# Patient Record
Sex: Male | Born: 1985 | Race: Black or African American | Hispanic: No | Marital: Single | Smoking: Never smoker
Health system: Southern US, Community
[De-identification: ages and names within clinical notes are randomized; demographics above are authoritative.]

---

## 2008-01-13 ENCOUNTER — Emergency Department (HOSPITAL_COMMUNITY): Admission: EM | Admit: 2008-01-13 | Discharge: 2008-01-13 | Payer: Self-pay | Admitting: Emergency Medicine

## 2016-12-23 ENCOUNTER — Encounter (HOSPITAL_COMMUNITY): Payer: Self-pay | Admitting: Emergency Medicine

## 2016-12-23 ENCOUNTER — Emergency Department (HOSPITAL_COMMUNITY)
Admission: EM | Admit: 2016-12-23 | Discharge: 2016-12-24 | Disposition: A | Discharge status: Home / Self Care | Payer: Self-pay | Attending: Emergency Medicine | Admitting: Emergency Medicine

## 2016-12-23 ENCOUNTER — Emergency Department (HOSPITAL_COMMUNITY): Payer: Self-pay

## 2016-12-23 DIAGNOSIS — S61217A Laceration without foreign body of left little finger without damage to nail, initial encounter: Secondary | ICD-10-CM

## 2016-12-23 DIAGNOSIS — Y998 Other external cause status: Secondary | ICD-10-CM | POA: Insufficient documentation

## 2016-12-23 DIAGNOSIS — Y9389 Activity, other specified: Secondary | ICD-10-CM | POA: Insufficient documentation

## 2016-12-23 DIAGNOSIS — Y929 Unspecified place or not applicable: Secondary | ICD-10-CM | POA: Insufficient documentation

## 2016-12-23 DIAGNOSIS — S61215A Laceration without foreign body of left ring finger without damage to nail, initial encounter: Secondary | ICD-10-CM | POA: Insufficient documentation

## 2016-12-23 DIAGNOSIS — W260XXA Contact with knife, initial encounter: Secondary | ICD-10-CM | POA: Insufficient documentation

## 2016-12-23 MED ORDER — OXYCODONE-ACETAMINOPHEN 5-325 MG PO TABS
1.0000 | ORAL_TABLET | Freq: Once | ORAL | Status: AC
  Administered 2016-12-24: 1 via ORAL
  Filled 2016-12-23: qty 1

## 2016-12-23 MED ORDER — TETANUS-DIPHTH-ACELL PERTUSSIS 5-2.5-18.5 LF-MCG/0.5 IM SUSP
0.5000 mL | Freq: Once | INTRAMUSCULAR | Status: AC
  Administered 2016-12-23: 0.5 mL via INTRAMUSCULAR
  Filled 2016-12-23: qty 0.5

## 2016-12-23 MED ORDER — LIDOCAINE HCL (PF) 1 % IJ SOLN
5.0000 mL | Freq: Once | INTRAMUSCULAR | Status: AC
  Administered 2016-12-23: 5 mL
  Filled 2016-12-23: qty 5

## 2016-12-23 NOTE — ED Triage Notes (Signed)
Pt reports cutting L pinky finger with knife while making dinner. Attempted to control bleeding but when pt removed bandage to take shower it started bleeding again. Wrapped and bleeding controlled in triage.

## 2016-12-24 MED ORDER — TRAMADOL HCL 50 MG PO TABS: 50.0000 mg | ORAL_TABLET | Freq: Four times a day (QID) | ORAL | 0 refills | Status: DC | PRN

## 2016-12-24 NOTE — ED Provider Notes (Signed)
[facility]-EMERGENCY DEPT Provider Note   CSN: [id number] Arrival date & time: 12/23/16  2231     History   Chief Complaint Chief Complaint  Patient presents with  . Finger Injury    HPI Troy Nielsen is a 31 y.o. male.  HPI   Patient is a 31 year old male with no pertinent past medical history presents the ED with complaint of finger laceration, onset 7 PM. Patient reports while he was cutting cilantro he cut his left pinky finger with a kitchen knife. He reports cleaning the wound with water and peroxide at home. Patient reports the bleeding was controlled with compression and applying a Band-Aid but notes when he removed the Band-Aid prior to taking a shower the bleeding return. Endorses associated pain. Denies numbness, weakness, decreased range of motion. Denies taking medications prior to arrival. Denies use of anticoagulants. Tetanus is not up-to-date.  History reviewed. No pertinent past medical history.  There are no active problems to display for this patient.   History reviewed. No pertinent surgical history.     Home Medications    Prior to Admission medications   Medication Sig Start Date End Date Taking? Authorizing Provider  traMADol (ULTRAM) 50 MG tablet Take 1 tablet (50 mg total) by mouth every 6 (six) hours as needed. 12/24/16   Barrett Henle, PA-C    Family History No family history on file.  Social History Social History  Substance Use Topics  . Smoking status: Not on file  . Smokeless tobacco: Not on file  . Alcohol use Not on file     Allergies   Patient has no known allergies.   Review of Systems Review of Systems  Constitutional: Negative for fever.  Musculoskeletal: Positive for arthralgias (left pinky finger).  Skin: Positive for wound (laceration).  Neurological: Negative for weakness and numbness.     Physical Exam Updated Vital Signs BP 129/68 (BP Location: Right Arm)   Pulse 87   Temp 98.6 F (37 C) (Oral)    Resp 18   Ht 6\' 1"  (1.854 m)   Wt 81.6 kg (180 lb)   SpO2 97%   BMI 23.75 kg/m   Physical Exam  Constitutional: He is oriented to person, place, and time. He appears well-developed and well-nourished.  HENT:  Head: Normocephalic and atraumatic.  Eyes: Conjunctivae and EOM are normal. Right eye exhibits no discharge. Left eye exhibits no discharge. No scleral icterus.  Neck: Normal range of motion. Neck supple.  Cardiovascular: Normal rate and intact distal pulses.   Pulmonary/Chest: Effort normal.  Musculoskeletal: Normal range of motion. He exhibits no edema or deformity.  FROM of left fingers, hand, wrist, forearm and elbow with 5/5 strength. Equal grip strength bilaterally. 2+ radial pulse. Sensation intact.  Neurological: He is alert and oriented to person, place, and time. No sensory deficit.  Skin: Skin is warm and dry. Capillary refill takes less than 2 seconds.  1.5cm laceration present to finger pad of left pinky finger with small visible arterial bleed, controlled with pressure. No visible bone or tendons. Mild TTP.   Nursing note and vitals reviewed.    ED Treatments / Results  Labs (all labs ordered are listed, but only abnormal results are displayed) Labs Reviewed - No data to display  EKG  EKG Interpretation None       Radiology No results found.  Procedures .Marland KitchenLaceration Repair Date/Time: 12/24/2016 12:15 AM Performed by: Barrett Henle Authorized by: Barrett Henle   Consent:  Consent obtained:  Verbal   Consent given by:  Patient Anesthesia (see MAR for exact dosages):    Anesthesia method:  Local infiltration   Local anesthetic:  Lidocaine 1% w/o epi Laceration details:    Location:  Finger   Finger location:  L small finger   Length (cm):  1.5 Repair type:    Repair type:  Simple Pre-procedure details:    Preparation:  Patient was prepped and draped in usual sterile fashion Exploration:    Hemostasis achieved with:   Tied off vessels (one 5-0 vicryl figure 8 suture)   Wound exploration: wound explored through full range of motion and entire depth of wound probed and visualized     Wound extent: no foreign bodies/material noted, no muscle damage noted, no nerve damage noted and no tendon damage noted     Contaminated: no   Treatment:    Area cleansed with:  Betadine and saline   Amount of cleaning:  Standard   Irrigation solution:  Sterile saline   Irrigation method:  Syringe   Visualized foreign bodies/material removed: no   Skin repair:    Repair method:  Sutures   Suture size:  5-0   Suture material:  Prolene   Suture technique:  Simple interrupted   Number of sutures:  3 Approximation:    Approximation:  Close   Vermilion border: well-aligned   Post-procedure details:    Dressing:  Antibiotic ointment and non-adherent dressing   Patient tolerance of procedure:  Tolerated well, no immediate complications    (including critical care time)  Medications Ordered in ED Medications  lidocaine (PF) (XYLOCAINE) 1 % injection 5 mL (5 mLs Infiltration Given 12/23/16 2334)  Tdap (BOOSTRIX) injection 0.5 mL (0.5 mLs Intramuscular Given 12/23/16 2333)  oxyCODONE-acetaminophen (PERCOCET/ROXICET) 5-325 MG per tablet 1 tablet (1 tablet Oral Given 12/24/16 0015)     Initial Impression / Assessment and Plan / ED Course  I have reviewed the triage vital signs and the nursing notes.  Pertinent labs & imaging results that were available during my care of the patient were reviewed by me and considered in my medical decision making (see chart for details).    Patient presents with laceration to his left pinky finger that occurred around 7 PM on he was cutting cilantro and his kitchen. Bleeding controlled with pressure. Endorses mild associated pain. Reports tetanus not up-to-date. VSS. Exam revealed 1.5 cm laceration present to finger pad of left pinky finger with small visible arterial bleed present, bleeding  controlled with pressure. Left hand otherwise neurovascularly intact. Bleeding controlled with single figure 8 suture and pressure dressing. Plan to order x-ray for evaluation of foreign bodies or bony disruption. I was notified by nurse that patient declined x-ray. After discussing reasoning for x-ray and possible risks associated with missing fx or foreign bodies and potential for infection, patient reported understanding of possible risks/complications but still continued to refuse x-ray. Tetanus updated in the ED. Pressure irrigation performed. Wound explored and base of wound visualized in a bloodless field without evidence of foreign body.  Laceration occurred < 8 hours prior to repair which was well tolerated. Pt has no comorbidities to effect normal wound healing. Pt discharged without antibiotics.  Discussed suture home care with patient and answered questions. Pt to follow-up for wound check and suture removal in 7 days; they are to return to the ED sooner for signs of infection. Pt is hemodynamically stable with no complaints prior to dc.     Final  Clinical Impressions(s) / ED Diagnoses   Final diagnoses:  Laceration of left little finger without foreign body without damage to nail, initial encounter    New Prescriptions New Prescriptions   TRAMADOL (ULTRAM) 50 MG TABLET    Take 1 tablet (50 mg total) by mouth every 6 (six) hours as needed.     Barrett Henleadeau, Nicole Elizabeth, PA-C 12/24/16 0036    Doug SouJacubowitz, Sam, MD 12/26/16 [id number]

## 2016-12-24 NOTE — Discharge Instructions (Signed)
Keep your wound clean using Dial antibacterial soap and water, pat dry. You may also apply a small amount of antibiotic ointment such as Neosporin to wound daily. You may take 600 mg ibuprofen every 6 hours as needed for pain relief. I also recommend resting, elevating and applying ice to right arm for 15-20 minutes 3-4 times daily to help with pain and swelling. Return to the emergency department or be seen by your primary care provider in 7 days for suture removal. Return to the emergency department sooner if symptoms worsen or new onset of fever, redness, swelling, warmth, drainage, decreased range of motion, numbness, tingling, weakness.

## 2017-08-20 ENCOUNTER — Encounter (HOSPITAL_COMMUNITY): Payer: Self-pay | Admitting: Emergency Medicine

## 2017-08-20 ENCOUNTER — Other Ambulatory Visit: Payer: Self-pay

## 2017-08-20 ENCOUNTER — Emergency Department (HOSPITAL_COMMUNITY): Payer: Self-pay

## 2017-08-20 ENCOUNTER — Emergency Department (HOSPITAL_COMMUNITY)
Admission: EM | Admit: 2017-08-20 | Discharge: 2017-08-20 | Disposition: A | Discharge status: Home / Self Care | Payer: Self-pay | Attending: Emergency Medicine | Admitting: Emergency Medicine

## 2017-08-20 DIAGNOSIS — S39012A Strain of muscle, fascia and tendon of lower back, initial encounter: Secondary | ICD-10-CM | POA: Insufficient documentation

## 2017-08-20 DIAGNOSIS — R102 Pelvic and perineal pain: Secondary | ICD-10-CM | POA: Insufficient documentation

## 2017-08-20 DIAGNOSIS — X500XXA Overexertion from strenuous movement or load, initial encounter: Secondary | ICD-10-CM | POA: Insufficient documentation

## 2017-08-20 DIAGNOSIS — Y929 Unspecified place or not applicable: Secondary | ICD-10-CM | POA: Insufficient documentation

## 2017-08-20 DIAGNOSIS — J069 Acute upper respiratory infection, unspecified: Secondary | ICD-10-CM | POA: Insufficient documentation

## 2017-08-20 DIAGNOSIS — Y939 Activity, unspecified: Secondary | ICD-10-CM | POA: Insufficient documentation

## 2017-08-20 DIAGNOSIS — Y990 Civilian activity done for income or pay: Secondary | ICD-10-CM | POA: Insufficient documentation

## 2017-08-20 LAB — CBC WITH DIFFERENTIAL/PLATELET
BASOS PCT: 0 %
Basophils Absolute: 0 10*3/uL (ref 0.0–0.1)
EOS ABS: 0.2 10*3/uL (ref 0.0–0.7)
EOS PCT: 1 %
HCT: 42.5 % (ref 39.0–52.0)
Hemoglobin: 14.1 g/dL (ref 13.0–17.0)
LYMPHS ABS: 1.3 10*3/uL (ref 0.7–4.0)
Lymphocytes Relative: 11 %
MCH: 30.4 pg (ref 26.0–34.0)
MCHC: 33.2 g/dL (ref 30.0–36.0)
MCV: 91.6 fL (ref 78.0–100.0)
Monocytes Absolute: 0.9 10*3/uL (ref 0.1–1.0)
Monocytes Relative: 7 %
Neutro Abs: 9.8 10*3/uL — ABNORMAL HIGH (ref 1.7–7.7)
Neutrophils Relative %: 81 %
PLATELETS: 226 10*3/uL (ref 150–400)
RBC: 4.64 MIL/uL (ref 4.22–5.81)
RDW: 12.8 % (ref 11.5–15.5)
WBC: 12.2 10*3/uL — AB (ref 4.0–10.5)

## 2017-08-20 LAB — COMPREHENSIVE METABOLIC PANEL
ALBUMIN: 4 g/dL (ref 3.5–5.0)
ALT: 31 U/L (ref 17–63)
ANION GAP: 10 (ref 5–15)
AST: 33 U/L (ref 15–41)
Alkaline Phosphatase: 68 U/L (ref 38–126)
BUN: 7 mg/dL (ref 6–20)
CHLORIDE: 101 mmol/L (ref 101–111)
CO2: 24 mmol/L (ref 22–32)
CREATININE: 1.06 mg/dL (ref 0.61–1.24)
Calcium: 9 mg/dL (ref 8.9–10.3)
GFR calc non Af Amer: >60 mL/min (ref >60)
Glucose, Bld: 100 mg/dL — ABNORMAL HIGH (ref 65–99)
Potassium: 4 mmol/L (ref 3.5–5.1)
SODIUM: 135 mmol/L (ref 135–145)
Total Bilirubin: 2 mg/dL — ABNORMAL HIGH (ref 0.3–1.2)
Total Protein: 7.1 g/dL (ref 6.5–8.1)

## 2017-08-20 LAB — HIV ANTIBODY (ROUTINE TESTING W REFLEX): HIV Screen 4th Generation wRfx: NONREACTIVE

## 2017-08-20 LAB — URINALYSIS, ROUTINE W REFLEX MICROSCOPIC
Bilirubin Urine: NEGATIVE
Glucose, UA: NEGATIVE mg/dL
Hgb urine dipstick: NEGATIVE
Ketones, ur: NEGATIVE mg/dL
LEUKOCYTES UA: NEGATIVE
NITRITE: NEGATIVE
Protein, ur: NEGATIVE mg/dL
SPECIFIC GRAVITY, URINE: 1.017 (ref 1.005–1.030)
pH: 9 — ABNORMAL HIGH (ref 5.0–8.0)

## 2017-08-20 MED ORDER — NAPROXEN 500 MG PO TABS: 500.0000 mg | ORAL_TABLET | Freq: Two times a day (BID) | ORAL | 0 refills | Status: DC

## 2017-08-20 MED ORDER — SODIUM CHLORIDE 0.9 % IV BOLUS (SEPSIS): 1000.0000 mL | Freq: Once | INTRAVENOUS | Status: DC

## 2017-08-20 MED ORDER — KETOROLAC TROMETHAMINE 15 MG/ML IJ SOLN
15.0000 mg | Freq: Once | INTRAMUSCULAR | Status: AC
  Administered 2017-08-20: 15 mg via INTRAVENOUS
  Filled 2017-08-20: qty 1

## 2017-08-20 MED ORDER — CYCLOBENZAPRINE HCL 10 MG PO TABS: 10.0000 mg | ORAL_TABLET | Freq: Two times a day (BID) | ORAL | 0 refills | Status: DC | PRN

## 2017-08-20 MED ORDER — ACETAMINOPHEN 325 MG PO TABS
650.0000 mg | ORAL_TABLET | Freq: Once | ORAL | Status: AC
  Administered 2017-08-20: 650 mg via ORAL
  Filled 2017-08-20: qty 2

## 2017-08-20 NOTE — ED Provider Notes (Signed)
[facility] EMERGENCY DEPARTMENT Provider Note   CSN: [id number] Arrival date & time: 08/20/17  0745     History   Chief Complaint Chief Complaint  Patient presents with  . Back Pain    HPI Troy Nielsen is a 32 y.o. male.  Patient is a 32 year old male presenting today with multiple complaints.  Patient initially came because of back pain that started yesterday.  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  He did not fall or feel a pop but later that day started having pain in his lower back that shoots into his buttocks.  He states it is excruciating when he tries to move but is not too bad at rest.  He denies any weakness or numbness of the legs and no radiation of pain down into the legs.  He has had no bowel incontinence or urinary retention.  However when asking about these things he states for the last 3 weeks he has had an easy redness and slight discomfort in his suprapubic area and feeling that he is not completely emptying his bladder.  He denies dysuria but does complain of frequency.  He has had no penile discharge but has noticed some swollen lymph nodes in his groin.  He has had no testicular swelling or redness.  No lesions on his penis.  He also has noticed during sex that he is having difficulty maintaining an erection and his orgasms are not the way they once were.  He also notes that yesterday he developed a cough and some chest congestion.  He has some mild pain in his chest with coughing but denies any shortness of breath.  Patient does not use any type of tobacco, illegal substances including IV drugs and no alcohol.  He takes no medications and has not taken any over-the-counter medications.   The history is provided by the patient.  Back Pain   This is a new problem. The current episode started yesterday. The problem occurs constantly. The problem has been gradually worsening.  Associated with: pt was pulling something heavy at work and later that day started having pain in the lower back. The pain is present in the lumbar spine. The quality of the pain is described as shooting, stabbing and cramping. The pain does not radiate. The pain is at a severity of 8/10. The pain is severe. The symptoms are aggravated by bending, twisting and certain positions. The pain is worse during the night. Stiffness is present in the morning. Pertinent negatives include no fever, no weight loss, no bladder incontinence, no tingling and no weakness. He has tried nothing for the symptoms. The treatment provided no relief. Risk factors: prior hx years ago of swollen inguinal nodes.    History reviewed. No pertinent past medical history.  There are no active problems to display for this patient.   History reviewed. No pertinent surgical history.     Home Medications    Prior to Admission medications   Medication Sig Start Date End Date Taking? Authorizing Provider  traMADol (ULTRAM) 50 MG tablet Take 1 tablet (50 mg total) by mouth every 6 (six) hours as needed. 12/24/16   Barrett Henle, PA-C    Family History No family history on file.  Social History Social History   Tobacco Use  . Smoking status: Never Smoker  . Smokeless tobacco: Never Used  Substance Use Topics  . Alcohol  use: No    Frequency: Never  . Drug use: No     Allergies   Patient has no known allergies.   Review of Systems Review of Systems  Constitutional: Negative for fever and weight loss.  Genitourinary: Negative for bladder incontinence.  Musculoskeletal: Positive for back pain.  Neurological: Negative for tingling and weakness.  All other systems reviewed and are negative.    Physical Exam Updated Vital Signs BP 112/65 (BP Location: Right Arm)   Pulse 95   Temp 99.1 F (37.3 C) (Oral)   Resp 18   Ht 6' (1.829 m)   Wt 72.6 kg (160 lb)   SpO2 98%   BMI 21.70 kg/m    Physical Exam  Constitutional: He is oriented to person, place, and time. He appears well-developed and well-nourished. No distress.  HENT:  Head: Normocephalic and atraumatic.  Mouth/Throat: Oropharynx is clear and moist.  Eyes: Conjunctivae and EOM are normal. Pupils are equal, round, and reactive to light.  Neck: Normal range of motion. Neck supple.  Cardiovascular: Normal rate, regular rhythm and intact distal pulses.  No murmur heard. Pulmonary/Chest: Effort normal and breath sounds normal. No respiratory distress. He has no wheezes. He has no rales.  Abdominal: Soft. He exhibits no distension. There is tenderness in the suprapubic area. There is no rebound, no guarding and no CVA tenderness.  Mild suprapubic tenderness with no rebound or guarding  Genitourinary: Testes normal and penis normal.  Genitourinary Comments: Mild nontender bilateral inguinal lymphadenopathy.  Nodes are mobile and nonerythematous  Musculoskeletal: He exhibits no edema or tenderness.       Lumbar back: He exhibits decreased range of motion, pain and spasm. He exhibits no bony tenderness and normal pulse.       Back:  Neurological: He is alert and oriented to person, place, and time. He has normal strength. No sensory deficit. Gait normal.  Skin: Skin is warm and dry. Capillary refill takes less than 2 seconds. No rash noted. No erythema.  Psychiatric: He has a normal mood and affect. His behavior is normal.  Nursing note and vitals reviewed.    ED Treatments / Results  Labs (all labs ordered are listed, but only abnormal results are displayed) Labs Reviewed  CBC WITH DIFFERENTIAL/PLATELET - Abnormal; Notable for the following components:      Result Value   WBC 12.2 (*)    Neutro Abs 9.8 (*)    All other components within normal limits  COMPREHENSIVE METABOLIC PANEL - Abnormal; Notable for the following components:   Glucose, Bld 100 (*)    Total Bilirubin 2.0 (*)    All other components within  normal limits  URINALYSIS, ROUTINE W REFLEX MICROSCOPIC - Abnormal; Notable for the following components:   pH 9.0 (*)    All other components within normal limits  HIV ANTIBODY (ROUTINE TESTING)  GC/CHLAMYDIA PROBE AMP ([place]) [facility]    EKG  EKG Interpretation None       Radiology Dg Chest 2 View  Result Date: 08/20/2017 CLINICAL DATA:  Lower back pain after slipping at work last night. Cough. EXAM: CHEST - 2 VIEW COMPARISON:  None. FINDINGS: The heart size and mediastinal contours are within normal limits. Both lungs are clear. The visualized skeletal structures are unremarkable. IMPRESSION: No active cardiopulmonary disease. Electronically Signed   By: Gerome Samavid  Williams III M.D   On: 08/20/2017 08:51    Procedures Procedures (including critical care time)  Medications Ordered in ED Medications  ketorolac (TORADOL) 15 MG/ML injection 15 mg (not administered)     Initial Impression / Assessment and Plan / ED Course  I have reviewed the triage vital signs and the nursing notes.  Pertinent labs & imaging results that were available during my care of the patient were reviewed by me and considered in my medical decision making (see chart for details).     Pt with gradual onset of back pain suggestive of MSK back pain.  No neurovascular compromise and no incontinence.  Pt has no hx of CA, IVDU, recent procedure of the back  or other red flags concerning for pathologic back pain.  Pt is able to ambulate but is painful.  Normal strength and reflexes on exam.  Denies trauma. Will give pt pain control.  However patient is also complaining of 3 weeks of mild suprapubic discomfort, urinary frequency and some occasional polydipsia.  He does have inguinal nodes and some mild suprapubic pain but no CVA tenderness or symptoms to suggest pyelonephritis.  He has no CVA tenderness.  He is hemodynamically stable.  He is also starting yesterday developed upper respiratory symptoms and this  may all be viral in nature however will check urine to rule out infection, CBC, CMP to ensure no evidence of diabetes or leukemia as he states he has had intermittent swollen lymph nodes. Pt given toradol for pain.  1:22 PM Labs are reassuring except for a mild leukocytosis of 12,000 but no differential concerning for leukemia.  CMP within normal limits.  Urine within normal limits.  Chest x-ray without acute findings.  Feel that patient's back pain is musculoskeletal in nature.  Also think he may be getting a viral URI.  HIV and GC chlamydia are pending.  Patient will be sent home with naproxen and Flexeril.   Final Clinical Impressions(s) / ED Diagnoses   Final diagnoses:  Strain of lumbar region, initial encounter  Viral upper respiratory tract infection    ED Discharge Orders        Ordered    naproxen (NAPROSYN) 500 MG tablet  2 times daily     08/20/17 1323    cyclobenzaprine (FLEXERIL) 10 MG tablet  2 times daily PRN     08/20/17 1323       Gwyneth Sprout, MD 08/20/17 1323

## 2017-08-20 NOTE — ED Triage Notes (Signed)
Pt c/o back pain started last night, worse this am-- middle low back area with sharp pains with walking.

## 2019-02-08 IMAGING — CR DG CHEST 2V
2 series · 2 of 2 positions shown · non-contrast
Comparison: None.

CLINICAL DATA: Lower back pain after slipping at work last night.
Cough.

EXAM:
CHEST - 2 VIEW

[chest lat]
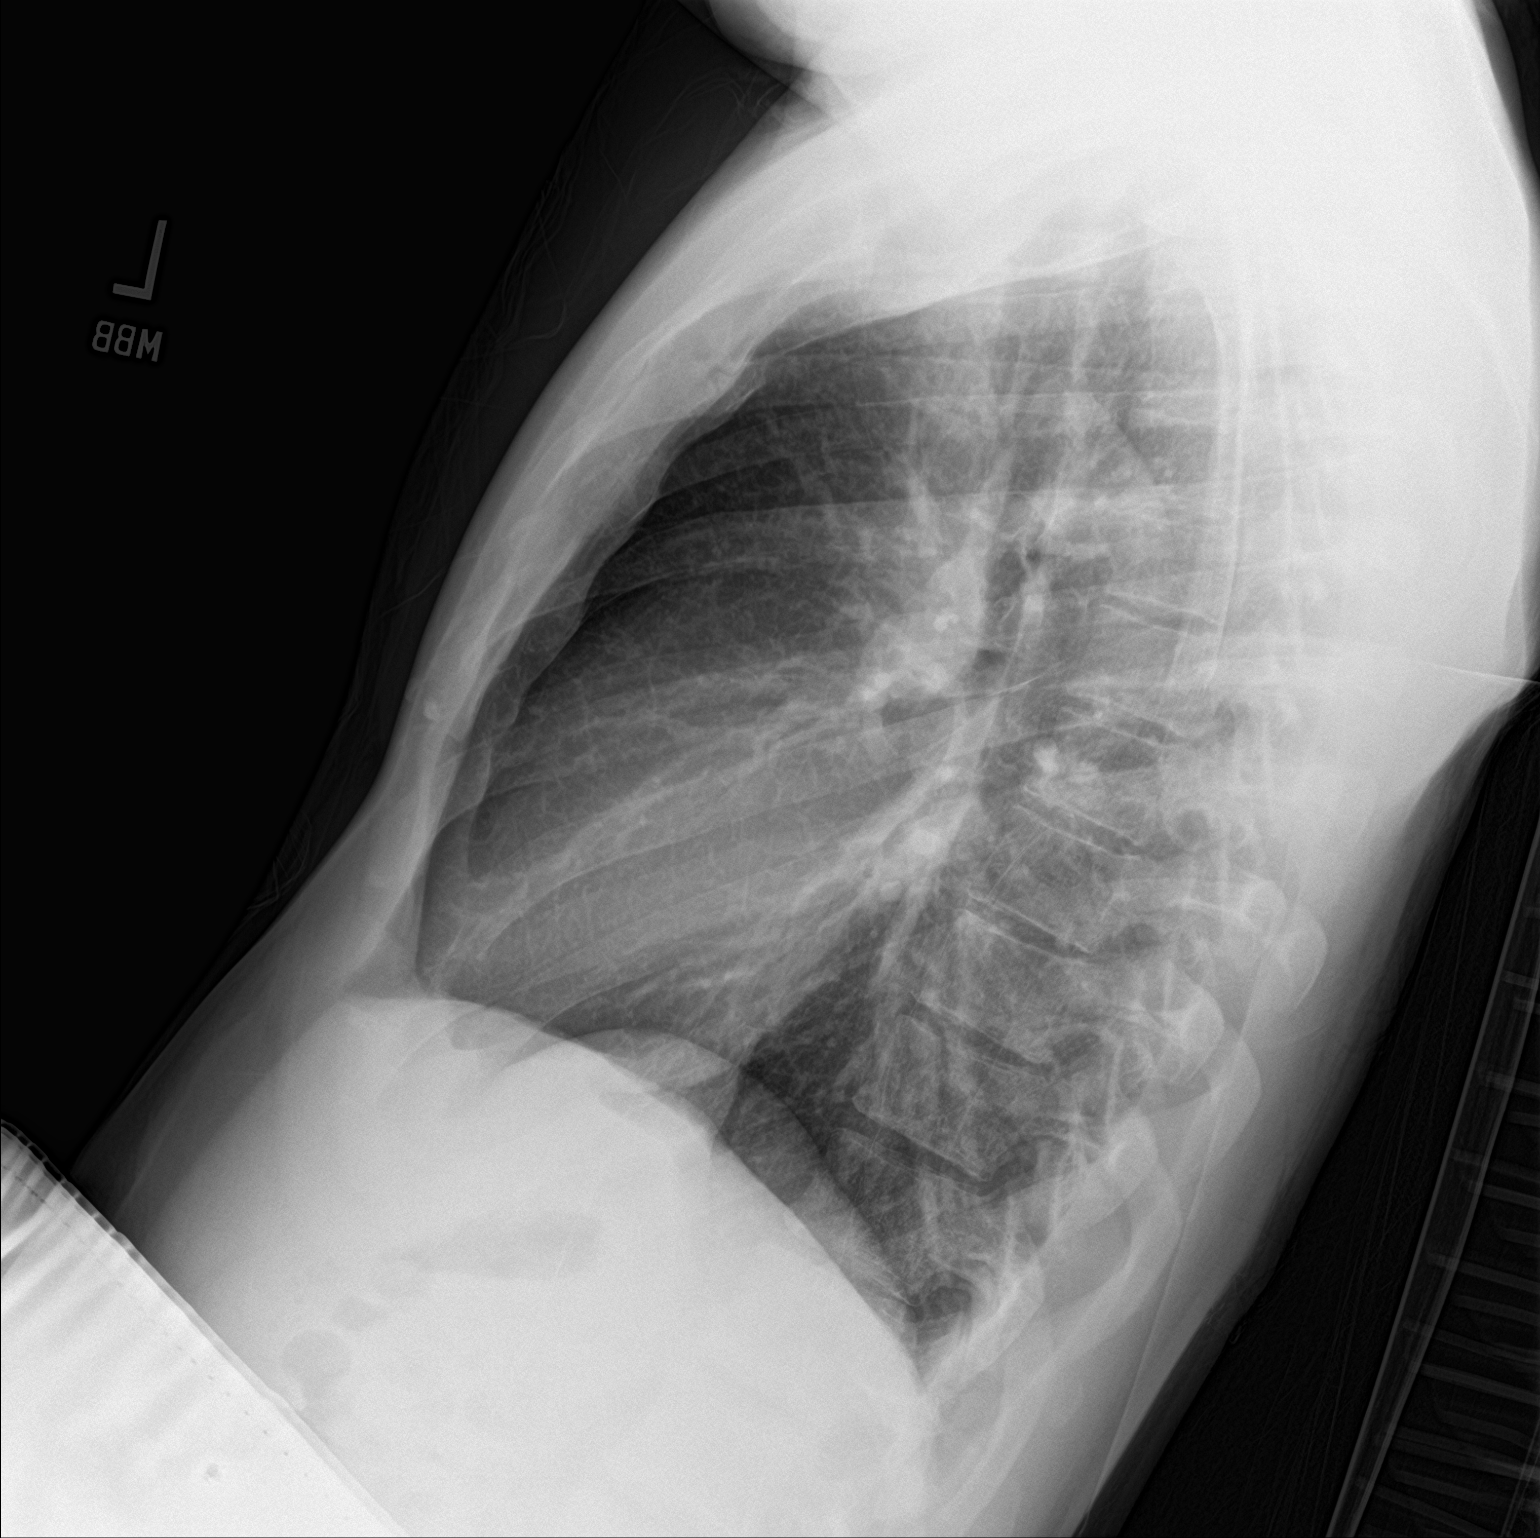

[chest ap]
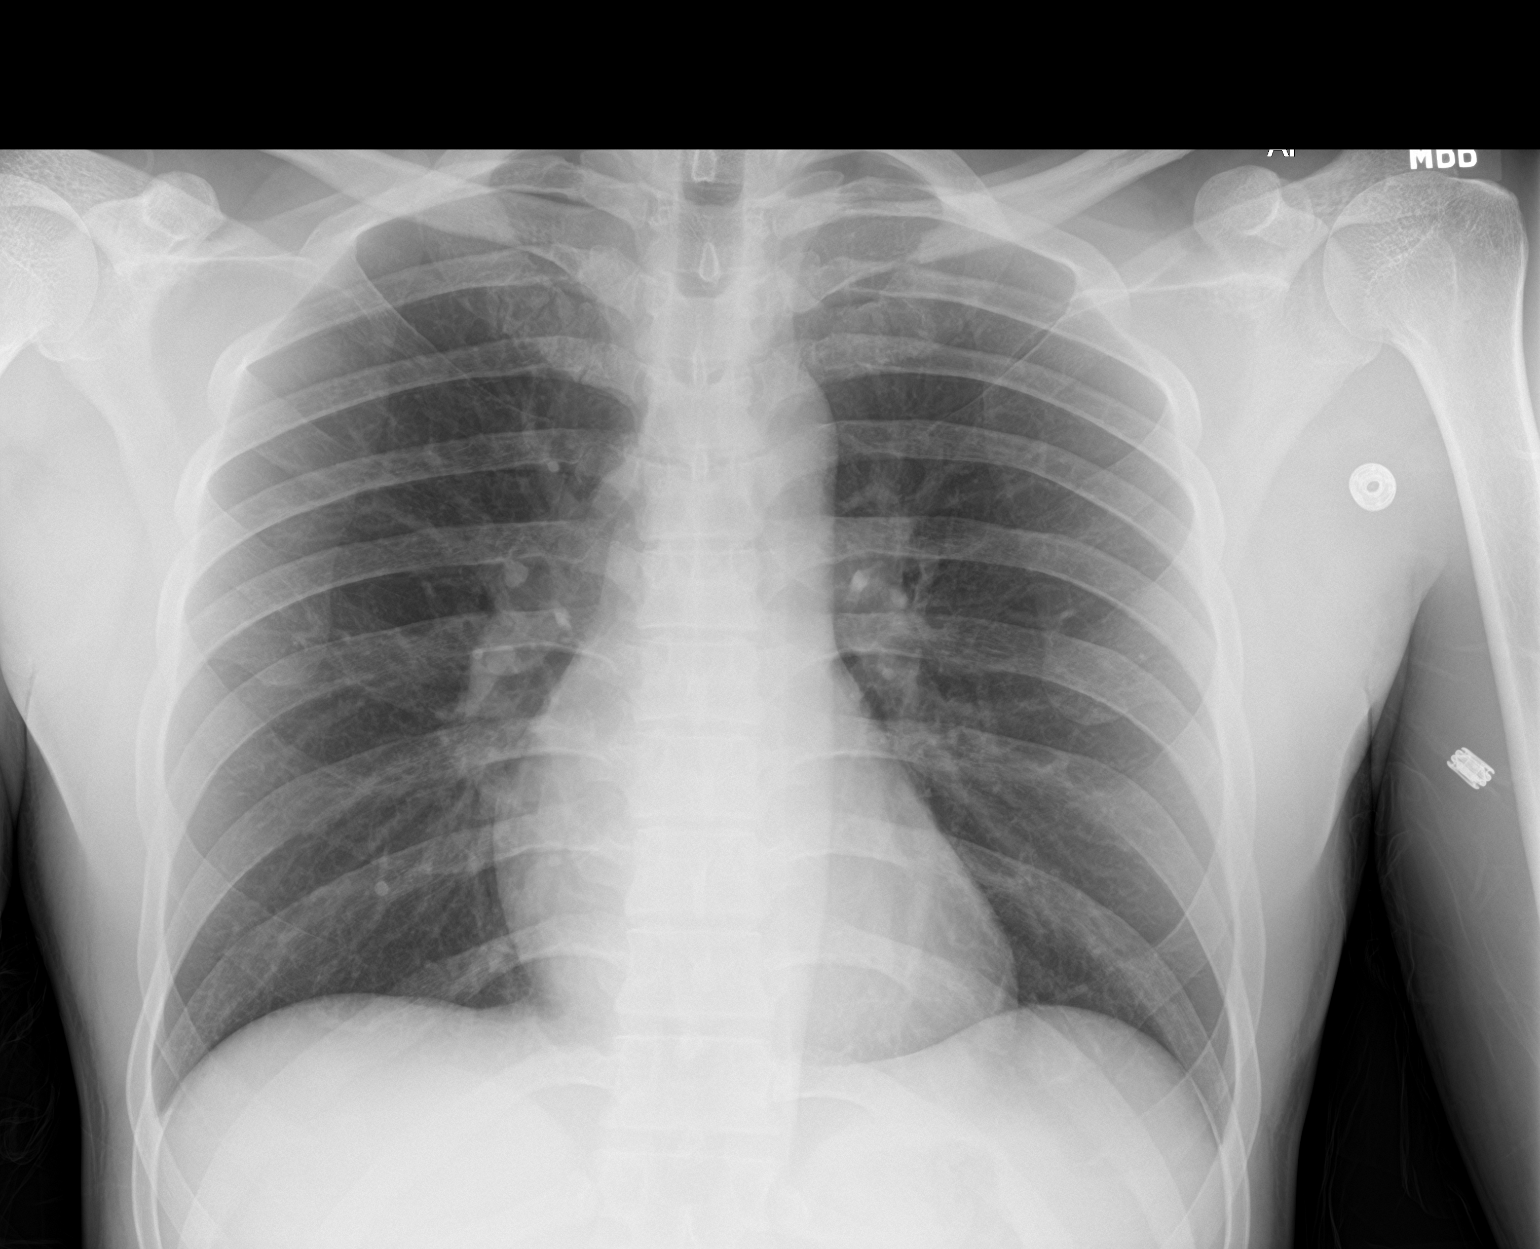

[2 of 2 positions shown; findings below may reference images not displayed]

FINDINGS: The heart size and mediastinal contours are within normal limits.
Both lungs are clear. The visualized skeletal structures are
unremarkable.
IMPRESSION: No active cardiopulmonary disease.

## 2020-03-06 ENCOUNTER — Emergency Department (HOSPITAL_COMMUNITY)
Admission: EM | Admit: 2020-03-06 | Discharge: 2020-03-06 | Disposition: A | Discharge status: Home / Self Care | Payer: Self-pay | Attending: Emergency Medicine | Admitting: Emergency Medicine

## 2020-03-06 ENCOUNTER — Encounter (HOSPITAL_COMMUNITY): Payer: Self-pay | Admitting: Emergency Medicine

## 2020-03-06 ENCOUNTER — Emergency Department (HOSPITAL_COMMUNITY): Payer: Self-pay

## 2020-03-06 ENCOUNTER — Other Ambulatory Visit: Payer: Self-pay

## 2020-03-06 DIAGNOSIS — R042 Hemoptysis: Secondary | ICD-10-CM | POA: Insufficient documentation

## 2020-03-06 DIAGNOSIS — K625 Hemorrhage of anus and rectum: Secondary | ICD-10-CM | POA: Insufficient documentation

## 2020-03-06 DIAGNOSIS — R109 Unspecified abdominal pain: Secondary | ICD-10-CM | POA: Insufficient documentation

## 2020-03-06 LAB — COMPREHENSIVE METABOLIC PANEL
ALT: 22 U/L (ref 0–44)
AST: 23 U/L (ref 15–41)
Albumin: 4 g/dL (ref 3.5–5.0)
Alkaline Phosphatase: 53 U/L (ref 38–126)
Anion gap: 5 (ref 5–15)
BUN: 6 mg/dL (ref 6–20)
CO2: 29 mmol/L (ref 22–32)
Calcium: 9.2 mg/dL (ref 8.9–10.3)
Chloride: 104 mmol/L (ref 98–111)
Creatinine, Ser: 1.11 mg/dL (ref 0.61–1.24)
GFR calc Af Amer: >60 mL/min (ref >60)
GFR calc non Af Amer: >60 mL/min (ref >60)
Glucose, Bld: 109 mg/dL — ABNORMAL HIGH (ref 70–99)
Potassium: 3.9 mmol/L (ref 3.5–5.1)
Sodium: 138 mmol/L (ref 135–145)
Total Bilirubin: 1.8 mg/dL — ABNORMAL HIGH (ref 0.3–1.2)
Total Protein: 7.1 g/dL (ref 6.5–8.1)

## 2020-03-06 LAB — CBC
HCT: 46.7 % (ref 39.0–52.0)
Hemoglobin: 15.2 g/dL (ref 13.0–17.0)
MCH: 30.9 pg (ref 26.0–34.0)
MCHC: 32.5 g/dL (ref 30.0–36.0)
MCV: 94.9 fL (ref 80.0–100.0)
Platelets: 234 10*3/uL (ref 150–400)
RBC: 4.92 MIL/uL (ref 4.22–5.81)
RDW: 12.1 % (ref 11.5–15.5)
WBC: 5.4 10*3/uL (ref 4.0–10.5)
nRBC: 0 % (ref 0.0–0.2)

## 2020-03-06 LAB — D-DIMER, QUANTITATIVE: D-Dimer, Quant: <0.27 ug/mL-FEU (ref 0.00–0.50)

## 2020-03-06 LAB — URINALYSIS, ROUTINE W REFLEX MICROSCOPIC
Bilirubin Urine: NEGATIVE
Glucose, UA: NEGATIVE mg/dL
Hgb urine dipstick: NEGATIVE
Ketones, ur: NEGATIVE mg/dL
Leukocytes,Ua: NEGATIVE
Nitrite: NEGATIVE
Protein, ur: NEGATIVE mg/dL
Specific Gravity, Urine: 1.024 (ref 1.005–1.030)
pH: 5 (ref 5.0–8.0)

## 2020-03-06 LAB — TYPE AND SCREEN
ABO/RH(D): O POS
Antibody Screen: NEGATIVE

## 2020-03-06 MED ORDER — KETOROLAC TROMETHAMINE 60 MG/2ML IM SOLN
60.0000 mg | Freq: Once | INTRAMUSCULAR | Status: AC
  Administered 2020-03-06: 60 mg via INTRAMUSCULAR
  Filled 2020-03-06: qty 2

## 2020-03-06 NOTE — ED Notes (Signed)
Patient transported to x-ray. ?

## 2020-03-06 NOTE — ED Triage Notes (Signed)
C/o coughing up blood x 1 month.  Reports bright red blood in stool x 1 week.  Reports frequent urination and L flank pain "like a needle" when taking a deep breath since yesterday.

## 2020-03-06 NOTE — ED Provider Notes (Signed)
4:40 PM Received signout at the beginning of shift, please see previous providers notes for complete H&P.  In short this is a 34 year old male present with multiple complaints.  His primary complaint is left flank pain since yesterday.  He also endorsed a month long of cough occasional productive of blood.  He also noticed blood in his urine.  Work-up today has been fairly unremarkable.  Urinalysis without signs of Neri tract infection, no blood in the urine.  Electrolyte panels are reassuring, normal WBC, normal H&H.  A D-dimer was obtained and was negative therefore low suspicion for PE causing hemoptysis.  He has normal platelet.  CT renal stone study obtained showing a 2 mm of nonobstructive calculus on the right kidney but no kidney stone on the left.  There is diffuse stool throughout the colon but without any concerning changes.  Chest x-ray unremarkable.  At this time no acute emergent medical condition identified.  Patient will be discharged home with symptomatic treatment and outpatient follow-up.  Return precaution discussed.  BP 123/83   Pulse 60   Temp 98.2 F (36.8 C) (Oral)   Resp 18   Ht 6' (1.829 m)   Wt 72.6 kg   SpO2 100%   BMI 21.70 kg/m   Results for orders placed or performed during the hospital encounter of 03/06/20  Comprehensive metabolic panel  Result Value Ref Range   Sodium 138 135 - 145 mmol/L   Potassium 3.9 3.5 - 5.1 mmol/L   Chloride 104 98 - 111 mmol/L   CO2 29 22 - 32 mmol/L   Glucose, Bld 109 (H) 70 - 99 mg/dL   BUN 6 6 - 20 mg/dL   Creatinine, Ser 6.26 0.61 - 1.24 mg/dL   Calcium 9.2 8.9 - 94.8 mg/dL   Total Protein 7.1 6.5 - 8.1 g/dL   Albumin 4.0 3.5 - 5.0 g/dL   AST 23 15 - 41 U/L   ALT 22 0 - 44 U/L   Alkaline Phosphatase 53 38 - 126 U/L   Total Bilirubin 1.8 (H) 0.3 - 1.2 mg/dL   GFR calc non Af Amer >60 >60 mL/min   GFR calc Af Amer >60 >60 mL/min   Anion gap 5 5 - 15  CBC  Result Value Ref Range   WBC 5.4 4.0 - 10.5 K/uL   RBC 4.92  4.22 - 5.81 MIL/uL   Hemoglobin 15.2 13.0 - 17.0 g/dL   HCT 54.6 39 - 52 %   MCV 94.9 80.0 - 100.0 fL   MCH 30.9 26.0 - 34.0 pg   MCHC 32.5 30.0 - 36.0 g/dL   RDW 27.0 35.0 - 09.3 %   Platelets 234 150 - 400 K/uL   nRBC 0.0 0.0 - 0.2 %  Urinalysis, Routine w reflex microscopic Urine, Clean Catch  Result Value Ref Range   Color, Urine YELLOW YELLOW   APPearance HAZY (A) CLEAR   Specific Gravity, Urine 1.024 1.005 - 1.030   pH 5.0 5.0 - 8.0   Glucose, UA NEGATIVE NEGATIVE mg/dL   Hgb urine dipstick NEGATIVE NEGATIVE   Bilirubin Urine NEGATIVE NEGATIVE   Ketones, ur NEGATIVE NEGATIVE mg/dL   Protein, ur NEGATIVE NEGATIVE mg/dL   Nitrite NEGATIVE NEGATIVE   Leukocytes,Ua NEGATIVE NEGATIVE  D-dimer, quantitative (not at [facility])  Result Value Ref Range   D-Dimer, Quant <0.27 0.00 - 0.50 ug/mL-FEU  Type and screen [facility]  Result Value Ref Range   ABO/RH(D) O POS    Antibody  Screen NEG    Sample Expiration      03/09/2020,2359 Performed at [facility], [address] [place], [place] [address]    DG Chest 2 View  Result Date: 03/06/2020 CLINICAL DATA:  Hemoptysis EXAM: CHEST - 2 VIEW COMPARISON:  08/20/2017 FINDINGS: The heart size and mediastinal contours are within normal limits. Both lungs are clear. The visualized skeletal structures are unremarkable. IMPRESSION: No active cardiopulmonary disease. Electronically Signed   By: Sharlet Salina M.D.   On: 03/06/2020 15:19   CT Renal Stone Study  Result Date: 03/06/2020 CLINICAL DATA:  Left flank pain EXAM: CT ABDOMEN AND PELVIS WITHOUT CONTRAST TECHNIQUE: Multidetector CT imaging of the abdomen and pelvis was performed following the standard protocol without oral or IV contrast. COMPARISON:  None. FINDINGS: Lower chest: Lung bases are clear. Hepatobiliary: No focal liver lesions are evident on this noncontrast enhanced study. The gallbladder wall is not appreciably thickened. There is no biliary duct  dilatation. Pancreas: There is no pancreatic mass or inflammatory focus. Spleen: No splenic lesions are evident. Adrenals/Urinary Tract: Adrenals bilaterally appear normal. There is no evident renal mass or hydronephrosis on either side. There is a 2 mm calculus in the mid to lower right kidney. No appreciable intrarenal calculus evident on the left. There is no appreciable ureteral calculus on either side. Urinary bladder is midline with wall thickness within normal limits. Stomach/Bowel: There is fairly diffuse stool throughout the colon. There is no appreciable bowel wall or mesenteric thickening. There is no evident bowel obstruction. Terminal ileum appears unremarkable. No evident free air or portal venous air. Vascular/Lymphatic: No abdominal aortic aneurysm. No vascular lesions evident on noncontrast enhanced study. No evident adenopathy in the abdomen or pelvis. Reproductive: Prostate and seminal vesicles normal in size and contour. A tiny calcification is noted in the prostate. Other: No evident appendiceal region inflammation. No evident abscess or ascites in the abdomen or pelvis. Musculoskeletal: No blastic or lytic bone lesions. No intramuscular or abdominal wall lesion evident. IMPRESSION: 1. 2 mm nonobstructing calculus mid to lower pole right kidney. No intrarenal calculi on the left. No ureteral calculus or hydronephrosis on either side. Urinary bladder wall thickness normal. 2. Diffuse stool throughout colon. No bowel wall thickening or bowel obstruction. No abscess in the abdomen pelvis. No periappendiceal region inflammation. Electronically Signed   By: Bretta Bang III M.D.   On: 03/06/2020 16:13      Fayrene Helper, PA-C 03/06/20 1648    Lorre Nick, MD 03/10/20 [id number]

## 2020-03-06 NOTE — ED Provider Notes (Signed)
[facility] EMERGENCY DEPARTMENT Provider Note   CSN: [id number] Arrival date & time: 03/06/20  6440     History Chief Complaint  Patient presents with  . Hemoptysis  . Rectal Bleeding  . Flank Pain    Troy Nielsen is a 34 y.o. male presents to the ER for evaluation of several concerns.  He states he has had blood in his cough for the last few months "or maybe years".  Describes it as feeling of buildup mucus in his throat and chest and he has to constantly clear his throat and cough.  He has noticed phlegm that comes up with streaks of bright red blood.  This is intermittent for the last few months maybe years.  He denies any chest pain.  No fevers.  No significant nasal congestion, rhinorrhea or postnasal drip.  No exposure to TB, recent incarceration, international travel.  No anticoagulation. He also reports needle sharp pain in the left flank onset yesterday with deep breathing.  No frank shortness of breath but states he can't really take a deep breath because it hurts his flank.  This is his primary concern.  States he mentioned the bloody cough "in case it was related", but mostly concerned about pain in his left flank.  Denies any trauma to the back, exercise.  Denies any dysuria, hematuria or urinary frequency.  No history of kidney stones.  No anterior abdominal pain.  No significant changes in BM including diarrhea or constipation but states he has hemorrhoids and has had some bright red blood with wiping.  This is not a new issue.  No pain with BM but does have to strain with BM and has hard stool.  Takes ibuprofen rarely for headaches. No known ulcers. No history of blood clots.  No active cancer treatment.  States he recently drove to [place] last week but he took frequent breaks.  No leg swelling or calf pain.  Patient states he feels that all of his symptoms "may be cancer".  When asked why he thought this he says that he feels his lymph nodes sometimes  swollen all over his body.  He smokes marijuana.  No tobacco use.  HPI     History reviewed. No pertinent past medical history.  There are no problems to display for this patient.   History reviewed. No pertinent surgical history.     No family history on file.  Social History   Tobacco Use  . Smoking status: Never Smoker  . Smokeless tobacco: Never Used  Vaping Use  . Vaping Use: Never used  Substance Use Topics  . Alcohol use: No  . Drug use: No    Home Medications Prior to Admission medications   Medication Sig Start Date End Date Taking? Authorizing Provider  Ascorbic Acid (VITAMIN C PO) Take 1 tablet by mouth daily.    [provider]  cyclobenzaprine (FLEXERIL) 10 MG tablet Take 1 tablet (10 mg total) by mouth 2 (two) times daily as needed for muscle spasms. 08/20/17   Gwyneth Sprout, MD  ECHINACEA PO Take 2 tablets by mouth daily.    [provider]  Homeopathic Products (CHESTAL HONEY COUGH PO) Take 10 mLs by mouth every 2 (two) hours as needed (Cough).    [provider]  Multiple Vitamin (MULTIVITAMIN WITH MINERALS) TABS tablet Take 2 tablets by mouth daily.    [provider]  naproxen (NAPROSYN) 500 MG tablet Take 1 tablet (500 mg total) by mouth  2 (two) times daily. 08/20/17   Gwyneth Sprout, MD  traMADol (ULTRAM) 50 MG tablet Take 1 tablet (50 mg total) by mouth every 6 (six) hours as needed. Patient not taking: Reported on 08/20/2017 12/24/16   Barrett Henle, PA-C    Allergies    Patient has no known allergies.  Review of Systems   Review of Systems  HENT: Positive for congestion.   Respiratory: Positive for cough (hemoptysis).   Gastrointestinal: Positive for blood in stool (bright red blood).  Genitourinary: Positive for flank pain (left).  All other systems reviewed and are negative.   Physical Exam Updated Vital Signs BP 123/83   Pulse 60   Temp 98.2 F (36.8 C) (Oral)   Resp 18   Ht 6'  (1.829 m)   Wt 72.6 kg   SpO2 100%   BMI 21.70 kg/m   Physical Exam Vitals and nursing note reviewed.  Constitutional:      General: He is not in acute distress.    Appearance: He is well-developed.     Comments: NAD.  HENT:     Head: Normocephalic and atraumatic.     Right Ear: External ear normal.     Left Ear: External ear normal.     Nose: Nose normal.     Comments: External nose is normal.  No sinus tenderness.  Nasal mucosa is pink without significant erythema, edema or rhinorrhea.    Mouth/Throat:     Comments: Moist mucous membranes.  Oropharynx and tonsils normal.  No signs of bleeding intraorally.  No postnasal drip noted on oropharynx. Eyes:     General: No scleral icterus.    Conjunctiva/sclera: Conjunctivae normal.  Cardiovascular:     Rate and Rhythm: Normal rate and regular rhythm.     Heart sounds: Normal heart sounds. No murmur heard.      Comments: No reproducible chest wall tenderness.  No pain on the chest with deep breathing. Pulmonary:     Effort: Pulmonary effort is normal.     Breath sounds: Normal breath sounds. No wheezing.  Abdominal:     Palpations: Abdomen is soft.     Tenderness: There is no abdominal tenderness.     Comments: Abdomen soft, nontender.  No reproducible tenderness with palpation or percussion of the flank.  Patient has no pain with movement.  Skin is normal in the flank/anterior abdomen.  Patient states his pain is in the left flank only with deep breathing.  Genitourinary:    Comments: Patient declined rectal exam, states he is not really worried about this and not a new problem. Musculoskeletal:        General: No deformity. Normal range of motion.     Cervical back: Normal range of motion and neck supple.  Skin:    General: Skin is warm and dry.     Capillary Refill: Capillary refill takes less than 2 seconds.  Neurological:     Mental Status: He is alert and oriented to person, place, and time.  Psychiatric:         Behavior: Behavior normal.        Thought Content: Thought content normal.        Judgment: Judgment normal.     ED Results / Procedures / Treatments   Labs (all labs ordered are listed, but only abnormal results are displayed) Labs Reviewed  COMPREHENSIVE METABOLIC PANEL - Abnormal; Notable for the following components:      Result Value   Glucose, Bld 109 (*)  Total Bilirubin 1.8 (*)    All other components within normal limits  URINALYSIS, ROUTINE W REFLEX MICROSCOPIC - Abnormal; Notable for the following components:   APPearance HAZY (*)    All other components within normal limits  CBC  D-DIMER, QUANTITATIVE (NOT AT [facility])  TYPE AND SCREEN  ABO/RH    EKG None  Radiology DG Chest 2 View  Result Date: 03/06/2020 CLINICAL DATA:  Hemoptysis EXAM: CHEST - 2 VIEW COMPARISON:  08/20/2017 FINDINGS: The heart size and mediastinal contours are within normal limits. Both lungs are clear. The visualized skeletal structures are unremarkable. IMPRESSION: No active cardiopulmonary disease. Electronically Signed   By: Sharlet Salina M.D.   On: 03/06/2020 15:19    Procedures Procedures (including critical care time)  Medications Ordered in ED Medications  ketorolac (TORADOL) injection 60 mg (60 mg Intramuscular Given 03/06/20 1434)    ED Course  I have reviewed the triage vital signs and the nursing notes.  Pertinent labs & imaging results that were available during my care of the patient were reviewed by me and considered in my medical decision making (see chart for details).    MDM Rules/Calculators/A&P                          34 year old presents with multiple complaints including what sounds like a chronic cough with intermittent, scant amount of blood mixed with the sputum for "months or maybe years", bright red blood with wiping in setting of constipation and hemorrhoids 4 months and 1 day of left flank pain.  Atraumatic.  His primary concern appears to be left flank  pain.  This is described as a sharp needle pain only with deep breathing.  ER work-up initiated in triage including screening labs, urinalysis.  This was personally visualized and interpreted.  Labs are unremarkable.  UA without infection or blood.  Exam is benign.  He declined rectal exam stating that he is not really worried about his bright red blood and only mentioned it in case it was related to all his other symptoms.  He describes constipation, straining to have a bowel movement, hard bowel movements with bright red painless red blood with wiping.  Discussed with him this is likely related to irritated hemorrhoids, recommended daily stool softener, intrarectal over-the-counter hemorrhoid creams and GI follow-up.  GI contact given.  He is in agreement with this.  He has no abdominal tenderness, nausea or vomiting, diarrhea. Will defer CTAP at this time given chronicity of symptoms, benign abdominal exam.   In regards to his hemoptysis, his exam is benign.  No postnasal drip or oropharyngeal abnormalities.  Lungs clear.  Afebrile.  No hypoxia.  No exposure to TB.  No history of blood clots.  He has no frank chest pain or shortness of breath but reports left upper flank pain with deep breathing.  Well score is one, D-dimer ordered.  I have very low suspicion for PE but unable to rule out with Wells criteria/PERC.  Chest x-ray is nonacute.  Pending D-dimer.  If negative, will recommend intranasal spray, Mucinex and to follow-up with pulmonology. Unclear cause.   In regards to his left flank pain, his exam is benign as well.  I cannot reproduce any pain with position changes, palpation or percussion.  Abdominal exam benign.  He has no urinary symptoms, kidney stones.  Given location of pain, will obtain CT renal.  Urinalysis without infection, RBCs.  Ureteral stone is less likely. ?muscular.  If negative, consider discharge with OTC NSAIDs.  1530: Patient handed off to oncoming ED PA who will follow up on  CT renal, D-dimer and determine disposition.  Anticipate discharge. Final Clinical Impression(s) / ED Diagnoses Final diagnoses:  Hemoptysis  Left flank pain  Bright red blood per rectum    Rx / DC Orders ED Discharge Orders    None       Liberty HandyGibbons, Giannamarie Paulus J, PA-C 03/06/20 1533    Virgina Norfolkuratolo, Adam, DO 03/07/20 [phone]

## 2020-03-06 NOTE — Discharge Instructions (Addendum)
Work up today was reassuring  Cause of your symptoms is unclear  Bloody intermittent cough and congestion can be treated with intranasal spray (flonase) twice daily and mucinex for cough  We discussed bright red blood with wiping, and this is likely related to irritated hemorrhoids.  Start taking a daily stool softener. You can use over the counter hemorrhoid creams.  Follow up with gastroenterology for more discussion of this. You declined rectal exam here.   CT renal did not show any explanation to your flank pain.  Use [phone] mg acetaminophen every 6-8 hours for pain for the next 72 hr for pain.    Return for fever greater than 100.39F, chest pain or shortness of breath with exertion, urinary symptoms, diarrhea

## 2021-08-25 IMAGING — CT CT RENAL STONE PROTOCOL
2 of 5 series · 16 of 46 positions shown, 18 images · non-contrast
Comparison: None.

CLINICAL DATA: Left flank pain

EXAM:
CT ABDOMEN AND PELVIS WITHOUT CONTRAST
TECHNIQUE: Multidetector CT imaging of the abdomen and pelvis was performed
following the standard protocol without oral or IV contrast.

[Series 5: thins · axial · 0.70mm/px · z∈[+768,+1162]mm · 13 of 612 slices shown, 15 images]
[im 24/612  soft-tissue]
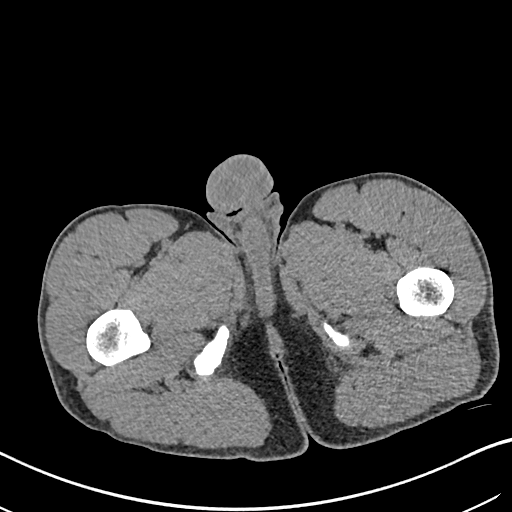
[im 24/612  bone]
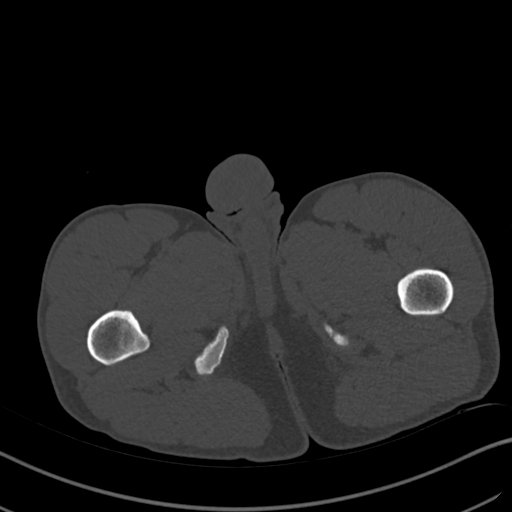
[im 71/612  soft-tissue]
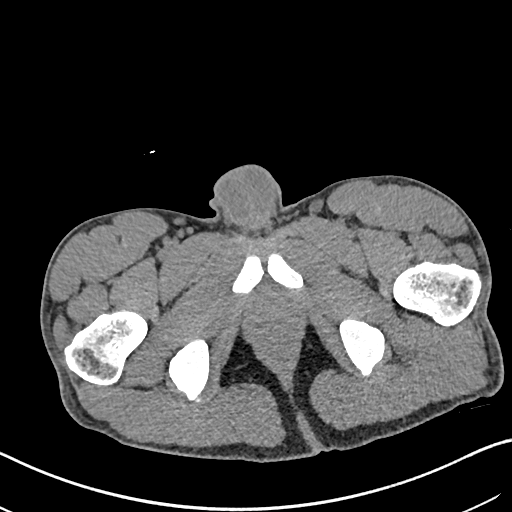
[im 118/612  soft-tissue]
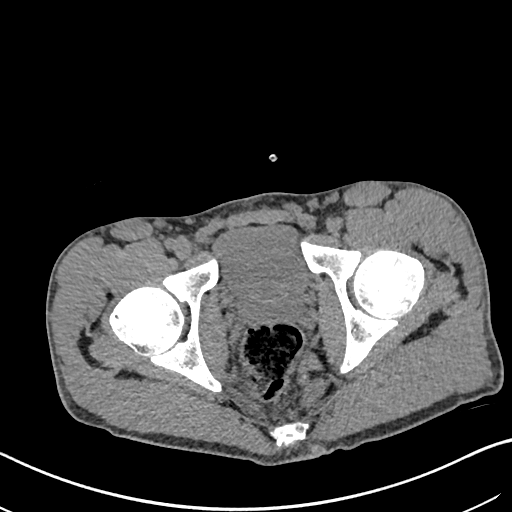
[im 165/612  soft-tissue]
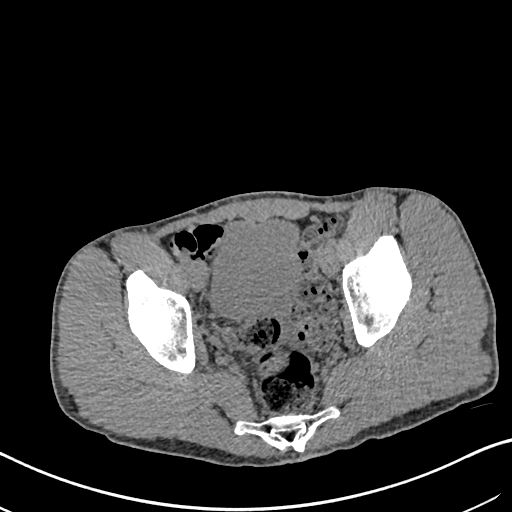
[im 212/612  soft-tissue]
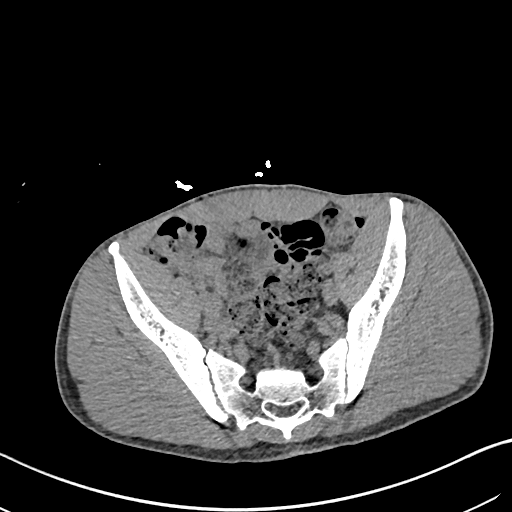
[im 259/612  soft-tissue]
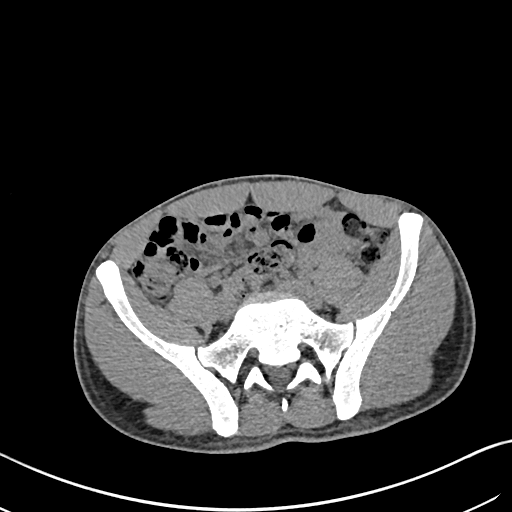
[im 306/612  soft-tissue]
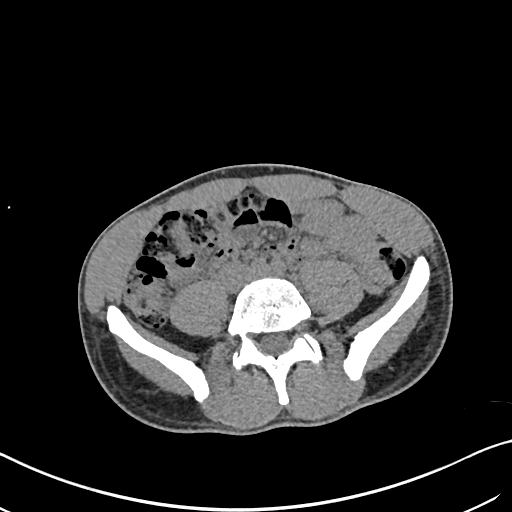
[im 353/612  soft-tissue]
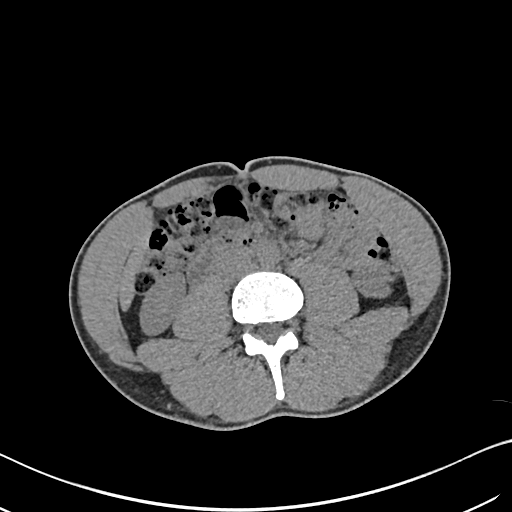
[im 400/612  soft-tissue]
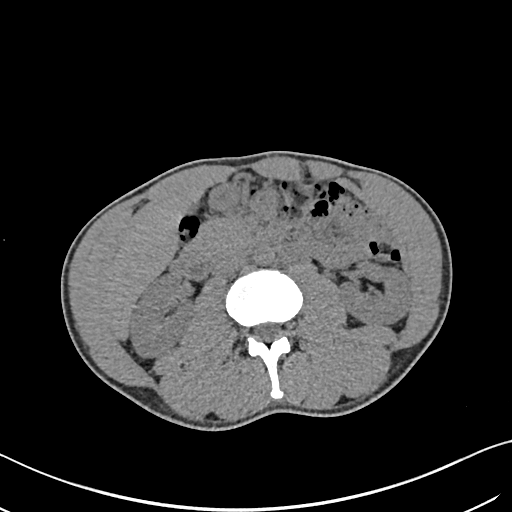
[im 400/612  bone]
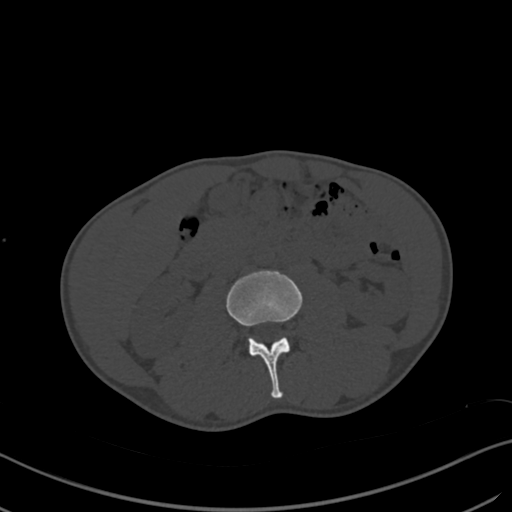
[im 447/612  soft-tissue]
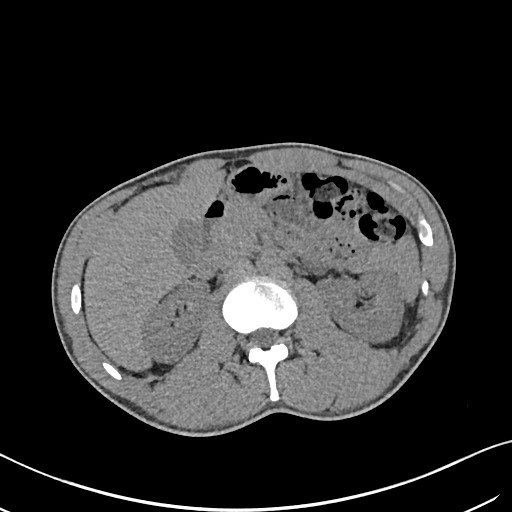
[im 494/612  soft-tissue]
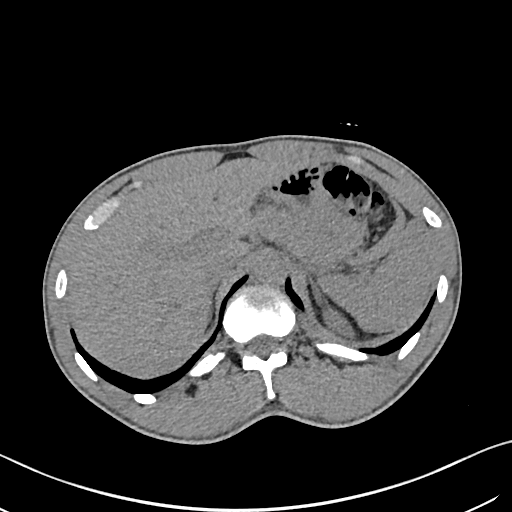
[im 541/612  soft-tissue]
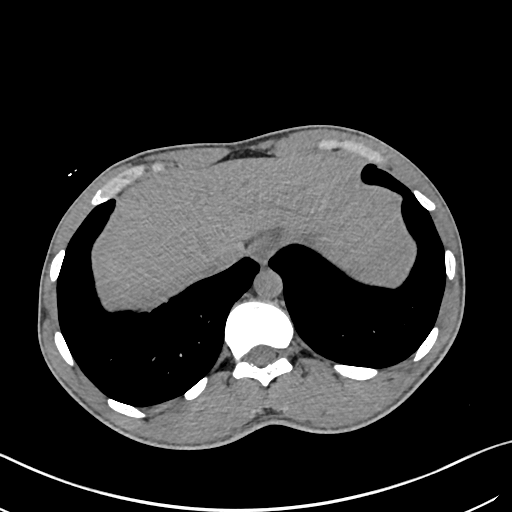
[im 588/612  soft-tissue]
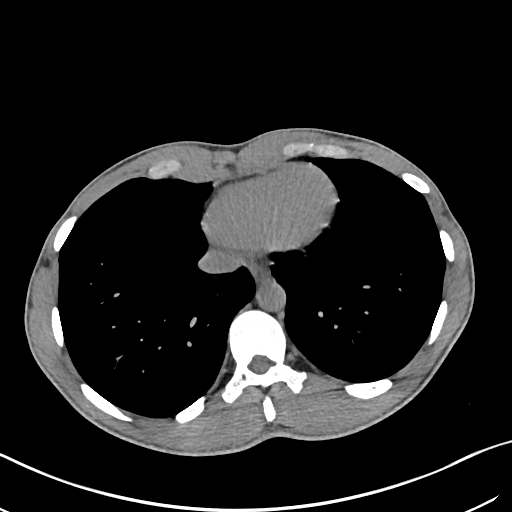

[Series 6: coronal soft tissue · coronal · 0.72mm/px · 3 of 99 slices shown]
[im 44/99  soft-tissue]
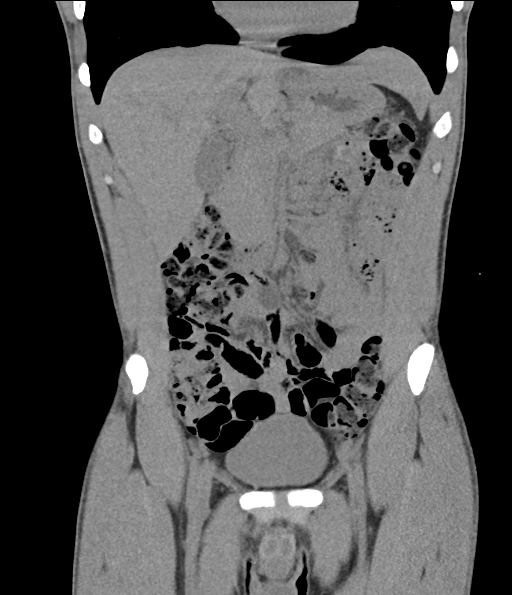
[im 55/99  soft-tissue]
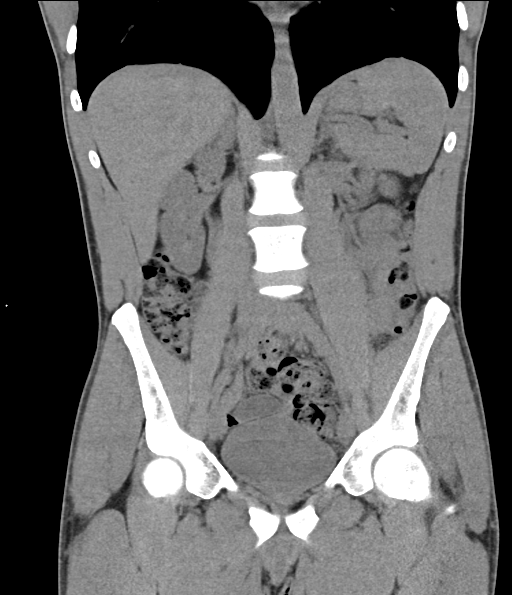
[im 66/99  soft-tissue]
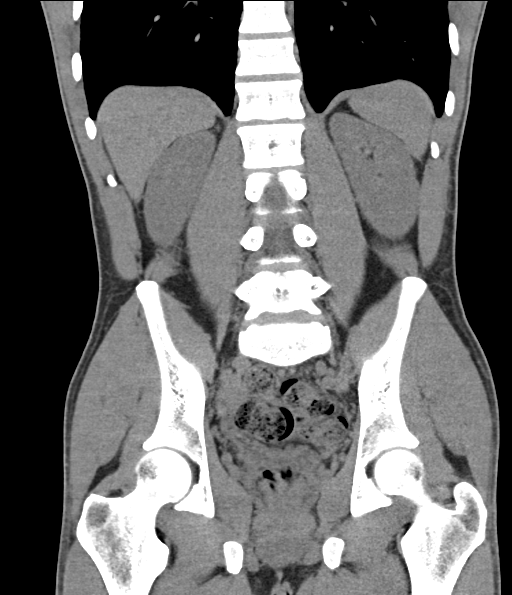

[16 of 46 positions shown; findings below may reference images not displayed]

FINDINGS: Lower chest: Lung bases are clear.

Hepatobiliary: No focal liver lesions are evident on this
noncontrast enhanced study. The gallbladder wall is not appreciably
thickened. There is no biliary duct dilatation.

Pancreas: There is no pancreatic mass or inflammatory focus.

Spleen: No splenic lesions are evident.

Adrenals/Urinary Tract: Adrenals bilaterally appear normal. There is
no evident renal mass or hydronephrosis on either side. There is a 2
mm calculus in the mid to lower right kidney. No appreciable
intrarenal calculus evident on the left. There is no appreciable
ureteral calculus on either side. Urinary bladder is midline with
wall thickness within normal limits.

Stomach/Bowel: There is fairly diffuse stool throughout the colon.
There is no appreciable bowel wall or mesenteric thickening. There
is no evident bowel obstruction. Terminal ileum appears
unremarkable. No evident free air or portal venous air.

Vascular/Lymphatic: No abdominal aortic aneurysm. No vascular
lesions evident on noncontrast enhanced study. No evident adenopathy
in the abdomen or pelvis.

Reproductive: Prostate and seminal vesicles normal in size and
contour. A tiny calcification is noted in the prostate.

Other: No evident appendiceal region inflammation. No evident
abscess or ascites in the abdomen or pelvis.

Musculoskeletal: No blastic or lytic bone lesions. No intramuscular
or abdominal wall lesion evident.
IMPRESSION: 1. 2 mm nonobstructing calculus mid to lower pole right kidney. No
intrarenal calculi on the left. No ureteral calculus or
hydronephrosis on either side. Urinary bladder wall thickness
normal.

2. Diffuse stool throughout colon. No bowel wall thickening or bowel
obstruction. No abscess in the abdomen pelvis. No periappendiceal
region inflammation.
